# Patient Record
Sex: Female | Born: 2000 | Race: White | Hispanic: No | Marital: Single | Smoking: Never smoker
Health system: Southern US, Community
[De-identification: ages and names within clinical notes are randomized; demographics above are authoritative.]

---
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
## 2022-01-25 ENCOUNTER — Ambulatory Visit: Payer: Managed Care, Other (non HMO) | Admitting: Adult Health

## 2022-02-01 ENCOUNTER — Other Ambulatory Visit: Payer: Self-pay

## 2022-02-01 ENCOUNTER — Other Ambulatory Visit: Payer: Self-pay | Admitting: Adult Health

## 2022-02-01 ENCOUNTER — Encounter: Payer: Self-pay | Admitting: *Deleted

## 2022-02-01 MED ORDER — LO LOESTRIN FE 1 MG-10 MCG / 10 MCG PO TABS
1.0000 | ORAL_TABLET | Freq: Every day | ORAL | 3 refills | Status: DC
  Filled 2022-02-01: qty 84, 84d supply, fill #0

## 2022-02-04 ENCOUNTER — Telehealth: Payer: Self-pay | Admitting: *Deleted

## 2022-02-04 ENCOUNTER — Telehealth: Payer: Self-pay | Admitting: Adult Health

## 2022-02-04 ENCOUNTER — Telehealth: Payer: Self-pay

## 2022-02-04 NOTE — Telephone Encounter (Signed)
Patient mother called stating that the patient was out of her birth control and needed a refill. I explained to her that do to Elianny not having a DPR on file that I would not be able to answer any question or send a msg back. I did inform her that the nurse replyed to the patient msg in her my chart for the patient to respond to that message they the RN would be able to assist her. She said fine. Then she calls back about 30 mins later with patient on the phone and while we were trying to explain to pt about the procedure mom starts getting loud, I coutined to try and talk to [facility] about the message sand what she needs to do and mom keep interputing and at no point did patient give me permission to speak to mother.  ?

## 2022-02-04 NOTE — Telephone Encounter (Signed)
the mother of the pt called about getting her prescription moved to a pharmacy in [place]. We advised the mother that she isnt on the mothers DRP papers and we cant discuss anything other than the pts appt date and time. the mother 3 ways the pt and the pt was advised that she was going to have to call the pharmacy and have the script changed to the pharmacy that shes interested in. The mother was speaking over the pt and stated that they arent doing it and that she we will change it and she works in Dentist and knows that we can that she does it all of the time. I stated to her that the pt can change it in her my chart and when she messages the nurse to verify the correct location of her pharmacy. She (the mother) asked to speak to my supervisor I told her my supervisors name.  ?

## 2022-02-04 NOTE — Telephone Encounter (Signed)
[name] contacted Morgan Rangel regarding medication transfer from [facility] to [facility] in [place]. Morgan Rangel stated that she had contacted [facility] and prescription will be transferred. [name] also explained to [name] that a form would have to be signed for any information to be released to her mom. Pt voiced understanding. JSY ?

## 2022-02-05 ENCOUNTER — Telehealth: Payer: Self-pay | Admitting: *Deleted

## 2022-02-05 ENCOUNTER — Other Ambulatory Visit: Payer: Self-pay

## 2022-02-05 NOTE — Telephone Encounter (Signed)
[facility] pharmacy transferred Lo Loestrin prescription to [facility] in [place] but [facility] stated they didn't have anything recent on her. I gave a verbal for Lo Loestrin Fe 1 mg-39mcg # 84 take 1 tab daily with 3 refills. Pt aware. JSY ?

## 2022-02-06 ENCOUNTER — Other Ambulatory Visit: Payer: Self-pay

## 2022-03-01 ENCOUNTER — Telehealth: Payer: Self-pay | Admitting: Adult Health

## 2022-03-01 MED ORDER — LO LOESTRIN FE 1 MG-10 MCG / 10 MCG PO TABS: 1.0000 | ORAL_TABLET | Freq: Every day | ORAL | 3 refills | Status: DC

## 2022-03-01 NOTE — Addendum Note (Signed)
Addended by: Cyril Mourning A on: 03/01/2022 11:28 AM   Modules accepted: Orders

## 2022-03-01 NOTE — Telephone Encounter (Signed)
When I called pt to resch her appt for next week she states that her birth control is going to run out in 4 days and wants to know if you will send a script in for [facility] to [facility] [address] Please advise

## 2022-03-01 NOTE — Telephone Encounter (Signed)
Pt aware lo Loestrin refilled.

## 2022-03-06 ENCOUNTER — Ambulatory Visit: Payer: Managed Care, Other (non HMO) | Admitting: Adult Health

## 2022-03-18 ENCOUNTER — Encounter: Payer: Self-pay | Admitting: Adult Health

## 2022-03-18 ENCOUNTER — Ambulatory Visit: Payer: Managed Care, Other (non HMO) | Admitting: Adult Health

## 2022-03-18 VITALS — BP 115/84 | HR 77 | Ht 62.0 in | Wt 146.0 lb

## 2022-03-18 DIAGNOSIS — Z3041 Encounter for surveillance of contraceptive pills: Secondary | ICD-10-CM | POA: Diagnosis not present

## 2022-03-18 MED ORDER — LO LOESTRIN FE 1 MG-10 MCG / 10 MCG PO TABS: 1.0000 | ORAL_TABLET | Freq: Every day | ORAL | 3 refills | Status: DC

## 2022-03-18 NOTE — Progress Notes (Signed)
  Subjective:     Patient ID: Morgan Rangel, female   DOB: 2001/01/17, 21 y.o.   MRN: [id number]  HPI Aishling is a 21 year old white female,single, G0P0 back in follow up on taking lo Loestrin and is doing good, no headaches or nausea and periods light and short. She is home from [place], and has cruise planned to [place] soon.   Review of Systems Periods short and light No headaches or nausea Reviewed past medical,surgical, social and family history. Reviewed medications and allergies.     Objective:   Physical Exam BP 115/84 (BP Location: Left Arm, Patient Position: Sitting, Cuff Size: Normal)   Pulse 77   Ht 5\' 2"  (1.575 m)   Wt 146 lb (66.2 kg)   LMP 02/27/2022 (Approximate)   BMI 26.70 kg/m     Skin warm and dry.  Lungs: clear to ausculation bilaterally. Cardiovascular: regular rate and rhythm.  Fall risk is low  Upstream - 03/18/22 0930       Pregnancy Intention Screening   Does the patient want to become pregnant in the next year? No    Does the patient's partner want to become pregnant in the next year? No    Would the patient like to discuss contraceptive options today? No      Contraception Wrap Up   Current Method Oral Contraceptive    End Method Oral Contraceptive             Assessment:     1. Encounter for surveillance of contraceptive pills Will continue lo Loestrin, I gave her 3 sample packs  Meds ordered this encounter  Medications   Norethindrone-Ethinyl Estradiol-Fe Biphas (LO LOESTRIN FE) 1 MG-10 MCG / 10 MCG tablet    Sig: Take 1 tablet by mouth daily. Take 1 daily by mouth    Dispense:  84 tablet    Refill:  3    BIN [id number], PCN [id number], GRP [id number]    Order Specific Question:   Supervising Provider    Answer:   Florian Buff [[id number]]       Plan:     Follow up in 1 year or sooner if needed

## 2022-11-14 ENCOUNTER — Other Ambulatory Visit: Payer: Self-pay | Admitting: Adult Health

## 2022-11-14 MED ORDER — NORETHIN ACE-ETH ESTRAD-FE 1-20 MG-MCG PO TABS: 1.0000 | ORAL_TABLET | Freq: Every day | ORAL | 4 refills | Status: DC

## 2022-11-14 NOTE — Progress Notes (Signed)
Change to junel 1/20 lo Loestrin not covered by insurance now

## 2022-12-11 ENCOUNTER — Other Ambulatory Visit: Payer: Self-pay | Admitting: Adult Health

## 2022-12-11 MED ORDER — NORETHIN ACE-ETH ESTRAD-FE 1-20 MG-MCG PO TABS: 1.0000 | ORAL_TABLET | Freq: Every day | ORAL | 3 refills | Status: DC

## 2022-12-11 NOTE — Progress Notes (Signed)
Rx loestrin 1/20, insurance did not cover lo loestrin

## 2023-11-19 ENCOUNTER — Other Ambulatory Visit: Payer: Self-pay | Admitting: Adult Health
